# Patient Record
Sex: Female | Born: 1935 | Race: White | Hispanic: No | Marital: Married | Smoking: Never smoker
Health system: Southern US, Community
[De-identification: ages and names within clinical notes are randomized; demographics above are authoritative.]

## PROBLEM LIST (undated history)

## (undated) DIAGNOSIS — E785 Hyperlipidemia, unspecified: Secondary | ICD-10-CM

---

## 2012-07-14 ENCOUNTER — Encounter: Payer: Self-pay | Admitting: Emergency Medicine

## 2012-07-14 ENCOUNTER — Emergency Department (INDEPENDENT_AMBULATORY_CARE_PROVIDER_SITE_OTHER)
Admission: EM | Admit: 2012-07-14 | Discharge: 2012-07-14 | Disposition: A | Discharge status: Home / Self Care | Payer: Medicare Other | Source: Home / Self Care | Attending: Family Medicine | Admitting: Family Medicine

## 2012-07-14 ENCOUNTER — Emergency Department (INDEPENDENT_AMBULATORY_CARE_PROVIDER_SITE_OTHER): Payer: Medicare Other

## 2012-07-14 DIAGNOSIS — W19XXXA Unspecified fall, initial encounter: Secondary | ICD-10-CM

## 2012-07-14 DIAGNOSIS — J069 Acute upper respiratory infection, unspecified: Secondary | ICD-10-CM

## 2012-07-14 DIAGNOSIS — S298XXA Other specified injuries of thorax, initial encounter: Secondary | ICD-10-CM

## 2012-07-14 DIAGNOSIS — S20219A Contusion of unspecified front wall of thorax, initial encounter: Secondary | ICD-10-CM

## 2012-07-14 DIAGNOSIS — R05 Cough: Secondary | ICD-10-CM

## 2012-07-14 DIAGNOSIS — R059 Cough, unspecified: Secondary | ICD-10-CM

## 2012-07-14 HISTORY — DX: Hyperlipidemia, unspecified: E78.5

## 2012-07-14 MED ORDER — AMOXICILLIN 875 MG PO TABS: 875.0000 mg | ORAL_TABLET | Freq: Two times a day (BID) | ORAL | Status: AC

## 2012-07-14 MED ORDER — GUAIFENESIN-CODEINE 100-10 MG/5ML PO SYRP: 10.0000 mL | ORAL_SOLUTION | Freq: Every day | ORAL | Status: AC

## 2012-07-14 NOTE — ED Notes (Signed)
Patient hurt left posterior ribs 07-03-13. Now has been congested and coughing x 3-4 days. Did have Flu vaccination and is current on Pneumonia immunization.

## 2012-07-14 NOTE — Discharge Instructions (Signed)
Take plain Robitussin (guaifenesin) twice daily for cough and congestion.  Increase fluid intake, rest. Stop all antihistamines for now, and other non-prescription cough/cold preparations. May take Ibuprofen for chest soreness.  Apply heating pad to right chest several times daily. Follow-up with family doctor if not improving 7 to 10 days.

## 2012-07-14 NOTE — ED Provider Notes (Signed)
History     CSN: [id number]  Arrival date & time 07/14/12  1504   First MD Initiated Contact with Patient 07/14/12 1622      Chief Complaint  Patient presents with  . Cough  . Nasal Congestion      HPI Comments: Patient states that she fell about 1 week ago striking her right posterior/lateral chest and she has had persistent discomfort there.  She has now developed URI symptoms with sore throat, sinus congestion, and cough resulting in increased right rib pain.  She complains of occasion shortness of breath and wheezing.  No fevers, chills, and sweats. She has had two episodes of pneumonia in the past.   The history is provided by the patient and a relative.    Past Medical History  Diagnosis Date  . Hyperlipidemia     History reviewed. No pertinent past surgical history.  History reviewed. No pertinent family history.  History  Substance Use Topics  . Smoking status: Never Smoker   . Smokeless tobacco: Not on file  . Alcohol Use: No    OB History    Grav Para Term Preterm Abortions TAB SAB Ect Mult Living                  Review of Systems + sore throat + cough + pleuritic pain right ribs + wheezing + nasal congestion ? post-nasal drainage No sinus pain/pressure No itchy/red eyes No earache No hemoptysis + SOB No fever/chills No nausea No vomiting No abdominal pain No diarrhea No urinary symptoms No skin rashes + fatigue No myalgias No headache Used OTC meds without relief  Allergies  Review of patient's allergies indicates no known allergies.  Home Medications   Current Outpatient Rx  Name  Route  Sig  Dispense  Refill  . AMOXICILLIN 875 MG PO TABS   Oral   Take 1 tablet (875 mg total) by mouth 2 (two) times daily.   20 tablet   0   . GUAIFENESIN-CODEINE 100-10 MG/5ML PO SYRP   Oral   Take 10 mLs by mouth at bedtime. for cough   120 mL   0     BP 194/78  Pulse 78  Temp 99 F (37.2 C) (Oral)  Resp 18  Ht 5\' 5"  (1.651 m)  Wt  119 lb (53.978 kg)  BMI 19.80 kg/m2  SpO2 91%  Physical Exam Nursing notes and Vital Signs reviewed. Appearance:  Patient appears in no acute distress Eyes:  Pupils are equal, round, and reactive to light and accomodation.  Extraocular movement is intact.  Conjunctivae are not inflamed  Ears:  Canals normal.  Tympanic membranes normal.  Nose:  Mildly congested turbinates.  No sinus tenderness.   Pharynx:  Normal Neck:  Supple.  No adenopathy Lungs:   Scattered rhonchi; no rales or wheezes.  Breath sounds are equal.  Chest:  Tenderness over right lateral/inferior ribs Heart:  Regular rate and rhythm without murmurs, rubs, or gallops.  Abdomen:  Nontender without masses or hepatosplenomegaly.  Bowel sounds are present.  No CVA or flank tenderness.  Extremities:  No edema.  No calf tenderness Skin:  No rash present.   ED Course  Procedures  none   Dg Ribs Unilateral W/chest Right  07/14/2012  *RADIOLOGY REPORT*  Clinical Data: Larey Seat and injured right lateral chest 1 week ago. Cough.  RIGHT RIBS AND CHEST - 3+ VIEW  Comparison: None.  Findings: Site of maximal pain and tenderness was marked with a metallic  BB.  No right rib fractures identified.  Generalized osseous demineralization.  Prominent costal cartilage calcification.  Cardiac silhouette normal in size.  Thoracic aorta mildly atherosclerotic.  Hilar and mediastinal contours otherwise unremarkable.  Lungs clear.  Bronchovascular markings normal. Pulmonary vascularity normal.  No pneumothorax.  No pleural effusions.  IMPRESSION:  1.  No right rib fractures identified.  Generalized osseous demineralization. 2.  No acute cardiopulmonary disease.   Original Report Authenticated By: Hulan Saas, M.D.      1. Contusion of ribs   2. Acute upper respiratory infections of unspecified site       MDM  With a history of pneumonia and right rib pain, will begin empiric amoxicillin.  Rx written for guaifenesin/codeine at bedtime. Take plain  Robitussin (guaifenesin) twice daily for cough and congestion.  Increase fluid intake, rest. Stop all antihistamines for now, and other non-prescription cough/cold preparations. May take Ibuprofen for chest soreness.  Apply heating pad to right chest several times daily. Follow-up with family doctor if not improving 7 to 10 days.         Lattie Haw, MD 07/18/12 [phone]

## 2012-08-24 ENCOUNTER — Emergency Department (INDEPENDENT_AMBULATORY_CARE_PROVIDER_SITE_OTHER)
Admission: EM | Admit: 2012-08-24 | Discharge: 2012-08-24 | Disposition: A | Discharge status: Home / Self Care | Payer: Medicare Other | Source: Home / Self Care | Attending: Family Medicine | Admitting: Family Medicine

## 2012-08-24 DIAGNOSIS — R309 Painful micturition, unspecified: Secondary | ICD-10-CM

## 2012-08-24 DIAGNOSIS — R300 Dysuria: Secondary | ICD-10-CM

## 2012-08-24 DIAGNOSIS — N3000 Acute cystitis without hematuria: Secondary | ICD-10-CM

## 2012-08-24 LAB — POCT URINALYSIS DIP (MANUAL ENTRY)
Protein Ur, POC: >=300
Spec Grav, UA: 1.025 (ref 1.005–1.03)
pH, UA: 6 (ref 5–8)

## 2012-08-24 MED ORDER — CEPHALEXIN 500 MG PO CAPS: 500.0000 mg | ORAL_CAPSULE | Freq: Two times a day (BID) | ORAL | Status: AC

## 2012-08-24 NOTE — ED Provider Notes (Signed)
History     CSN: [id number]  Arrival date & time 08/24/12  4098   First MD Initiated Contact with Patient 08/24/12 1049      Chief Complaint  Patient presents with  . Dysuria    x 1 day       Patient is a 79 y.o. female presenting with dysuria. The history is provided by the patient and a relative.  Dysuria  This is a new problem. The current episode started 2 days ago. The problem occurs every urination. The problem has been gradually worsening. The quality of the pain is described as burning. The pain is mild. There has been no fever. Associated symptoms include frequency, hematuria, hesitancy and urgency. Pertinent negatives include no chills, no sweats, no nausea, no vomiting, no discharge and no flank pain. She has tried nothing for the symptoms.    Past Medical History  Diagnosis Date  . Hyperlipidemia     History reviewed. No pertinent past surgical history.  Family History  Problem Relation Age of Onset  . Cancer Mother     breast  . Cancer Father     lung    History  Substance Use Topics  . Smoking status: Never Smoker   . Smokeless tobacco: Not on file  . Alcohol Use: No    OB History   Grav Para Term Preterm Abortions TAB SAB Ect Mult Living                  Review of Systems  Constitutional: Negative for chills.  Gastrointestinal: Negative for nausea and vomiting.  Genitourinary: Positive for dysuria, hesitancy, urgency, frequency and hematuria. Negative for flank pain.  All other systems reviewed and are negative.    Allergies  Review of patient's allergies indicates no known allergies.  Home Medications   Current Outpatient Rx  Name  Route  Sig  Dispense  Refill  . fish oil-omega-3 fatty acids 1000 MG capsule   Oral   Take 2 g by mouth daily.         . Red Yeast Rice 600 MG CAPS   Oral   Take by mouth.         Marland Kitchen amoxicillin (AMOXIL) 875 MG tablet   Oral   Take 1 tablet (875 mg total) by mouth 2 (two) times daily.   20  tablet   0   . cephALEXin (KEFLEX) 500 MG capsule   Oral   Take 1 capsule (500 mg total) by mouth 2 (two) times daily.   14 capsule   0   . guaiFENesin-codeine (ROBITUSSIN AC) 100-10 MG/5ML syrup   Oral   Take 10 mLs by mouth at bedtime. for cough   120 mL   0     BP 163/98  Pulse 80  Temp(Src) 97.8 F (36.6 C) (Oral)  Wt 91 lb (41.277 kg)  BMI 15.14 kg/m2  SpO2 97%  Physical Exam Nursing notes and Vital Signs reviewed. Appearance:  Patient appears healthy, stated age, and in no acute distress Eyes:  Pupils are equal, round, and reactive to light and accomodation.  Extraocular movement is intact.  Conjunctivae are not inflamed  Pharynx:  Normal; moist mucous membranes  Neck:  Supple.  No adenopathy Lungs:  Clear to auscultation.  Breath sounds are equal.  Heart:  Regular rate and rhythm without murmurs, rubs, or gallops.  Abdomen:  Nontender without masses or hepatosplenomegaly.  Bowel sounds are present.  No CVA or flank tenderness.  Extremities:  No  edema.  No calf tenderness Skin:  No rash present.   ED Course  Procedures  none  Labs Reviewed  POCT URINALYSIS DIP (MANUAL ENTRY) - Abnormal; Notable for the following: BIL small; KET trace; BLO large; PRO >= 300mg /dL; LEU large  URINE CULTURE pending      1. Painful urination   2. Acute cystitis       MDM  Urine culture pending. Begin Keflex. Increase fluid intake.  May take AZO, one tab three times daily for two days. If symptoms become significantly worse during the night or over the weekend, proceed to the local emergency room Followup with Family Doctor if not improved in one week.         Lattie Haw, MD 08/25/12 [phone]

## 2012-08-26 LAB — URINE CULTURE

## 2012-08-27 ENCOUNTER — Telehealth: Payer: Self-pay | Admitting: *Deleted

## 2012-08-27 NOTE — ED Notes (Signed)
spoke to pt she reports that her UTI s/s are better. she reports having some diarrhea yesterday which is better today. advised her of her urine culture results and to call back if she fails to improve or worsens and we can change ABT to Cipro 250mg  BID #10/0rf per dr Cathren Harsh. Pt agrees.

## 2013-07-19 ENCOUNTER — Encounter: Payer: Self-pay | Admitting: Emergency Medicine

## 2013-07-19 ENCOUNTER — Emergency Department
Admission: EM | Admit: 2013-07-19 | Discharge: 2013-07-19 | Disposition: A | Discharge status: Home / Self Care | Payer: Medicare Other | Source: Home / Self Care | Attending: Emergency Medicine | Admitting: Emergency Medicine

## 2013-07-19 DIAGNOSIS — H6690 Otitis media, unspecified, unspecified ear: Secondary | ICD-10-CM

## 2013-07-19 DIAGNOSIS — H6692 Otitis media, unspecified, left ear: Secondary | ICD-10-CM

## 2013-07-19 NOTE — ED Provider Notes (Signed)
CSN: [id number]     Arrival date & time 07/19/13  1758 History   First MD Initiated Contact with Patient 07/19/13 1833     Chief Complaint  Patient presents with  . Otalgia   (Consider location/radiation/quality/duration/timing/severity/associated sxs/prior Treatment) Patient is a 80 y.o. female presenting with ear pain. The history is provided by the patient (and daughter).  Otalgia Location:  Left Quality:  Unable to specify Severity:  Moderate Onset quality:  Unable to specify Duration:  3 days Timing:  Constant Progression:  Unchanged Chronicity:  New Context: not direct blow, not elevation change, not foreign body in ear, not loud noise and no water in ear   Ineffective treatments: Tylenol. Associated symptoms: no abdominal pain, no congestion, no cough, no diarrhea, no ear discharge, no fever, no headaches, no hearing loss, no neck pain, no rash, no rhinorrhea, no sore throat, no tinnitus and no vomiting   Risk factors: no prior ear surgery     Past Medical History  Diagnosis Date  . Hyperlipidemia    History reviewed. No pertinent past surgical history. Family History  Problem Relation Age of Onset  . Cancer Mother     breast  . Cancer Father     lung   History  Substance Use Topics  . Smoking status: Never Smoker   . Smokeless tobacco: Not on file  . Alcohol Use: No   OB History   Grav Para Term Preterm Abortions TAB SAB Ect Mult Living                 Review of Systems  Constitutional: Negative for fever.  HENT: Positive for ear pain. Negative for congestion, ear discharge, hearing loss, rhinorrhea, sore throat and tinnitus.   Respiratory: Negative for cough.   Gastrointestinal: Negative for vomiting, abdominal pain and diarrhea.  Musculoskeletal: Negative for neck pain.  Skin: Negative for rash.  Neurological: Negative for headaches.  All other systems reviewed and are negative.    Allergies  Review of patient's allergies indicates no known  allergies.  Home Medications   Current Outpatient Rx  Name  Route  Sig  Dispense  Refill  . amoxicillin (AMOXIL) 875 MG tablet   Oral   Take 1 tablet (875 mg total) by mouth 2 (two) times daily.   20 tablet   0   . cephALEXin (KEFLEX) 500 MG capsule   Oral   Take 1 capsule (500 mg total) by mouth 2 (two) times daily.   14 capsule   0   . fish oil-omega-3 fatty acids 1000 MG capsule   Oral   Take 2 g by mouth daily.         Marland Kitchen guaiFENesin-codeine (ROBITUSSIN AC) 100-10 MG/5ML syrup   Oral   Take 10 mLs by mouth at bedtime. for cough   120 mL   0   . Red Yeast Rice 600 MG CAPS   Oral   Take by mouth.          BP 182/82  Pulse 83  Temp(Src) 97.7 F (36.5 C) (Oral)  Resp 16  Ht 5\' 5"  (1.651 m)  Wt 116 lb 4 oz (52.731 kg)  BMI 19.35 kg/m2  SpO2 96% Physical Exam  Nursing note and vitals reviewed. Constitutional: She is oriented to person, place, and time. She appears well-developed and well-nourished. No distress.  HENT:  Head: Normocephalic and atraumatic.  Right Ear: Tympanic membrane, external ear and ear canal normal. No tenderness.  Left Ear: External ear and  ear canal normal. No drainage or tenderness. Tympanic membrane is injected, perforated (Tiny) and erythematous.  Nose: Mucosal edema (Minimal) present. No rhinorrhea.  Mouth/Throat: Oropharynx is clear and moist and mucous membranes are normal. No oral lesions.  Eyes: Conjunctivae are normal. No scleral icterus.  Neck: Neck supple.  Cardiovascular: Normal rate, regular rhythm and normal heart sounds.   Pulmonary/Chest: Effort normal and breath sounds normal.  Lymphadenopathy:    She has no cervical adenopathy.  Neurological: She is alert and oriented to person, place, and time.  Skin: Skin is warm and dry.    ED Course  Procedures (including critical care time) Labs Review Labs Reviewed - No data to display Imaging Review No results found.  EKG Interpretation    Date/Time:    Ventricular  Rate:    PR Interval:    QRS Duration:   QT Interval:    QTC Calculation:   R Axis:     Text Interpretation:              MDM   1. Left otitis media    With tiny perforation .  Treatment options discussed, as well as risks, benefits, alternatives. Patient and daughter voiced understanding and agreement with the following plans: Amoxicillin 875 mg twice a day x10 days  Ibuprofen 600 mg every 6 hours with food when necessary pain Followup with ENT within 10 days. Precautions discussed. Red flags discussed. Questions invited and answered. Patient and daughter voiced understanding and agreement.      Lajean Manesavid Massey, MD 07/19/13 2009

## 2013-07-19 NOTE — ED Notes (Addendum)
Patient c/o left ear pain states she was diagnosed on Weds with a rupture eardrum. She was not given anything at that time. She has tried OTC Tylenol without relief.

## 2013-11-11 IMAGING — CR DG RIBS W/ CHEST 3+V*R*
3 series · 3 of 3 positions shown · non-contrast
Comparison: None.

CLINICAL DATA: Fell and injured right lateral chest 1 week ago.
Cough.

RIGHT RIBS AND CHEST - 3+ VIEW

[view not recorded (1 of 3)]
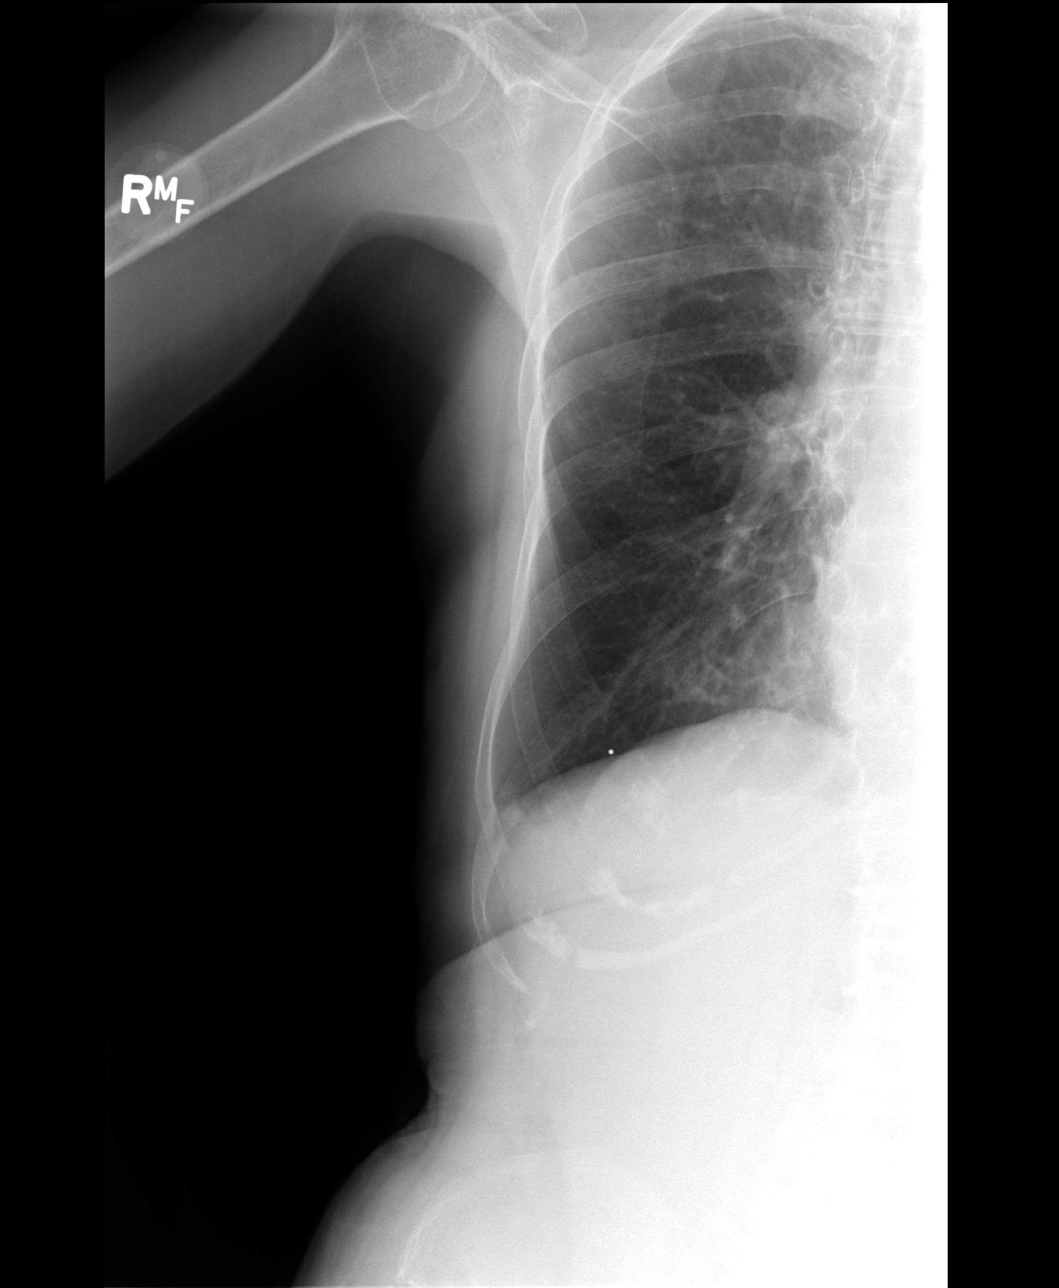

[view not recorded (2 of 3)]
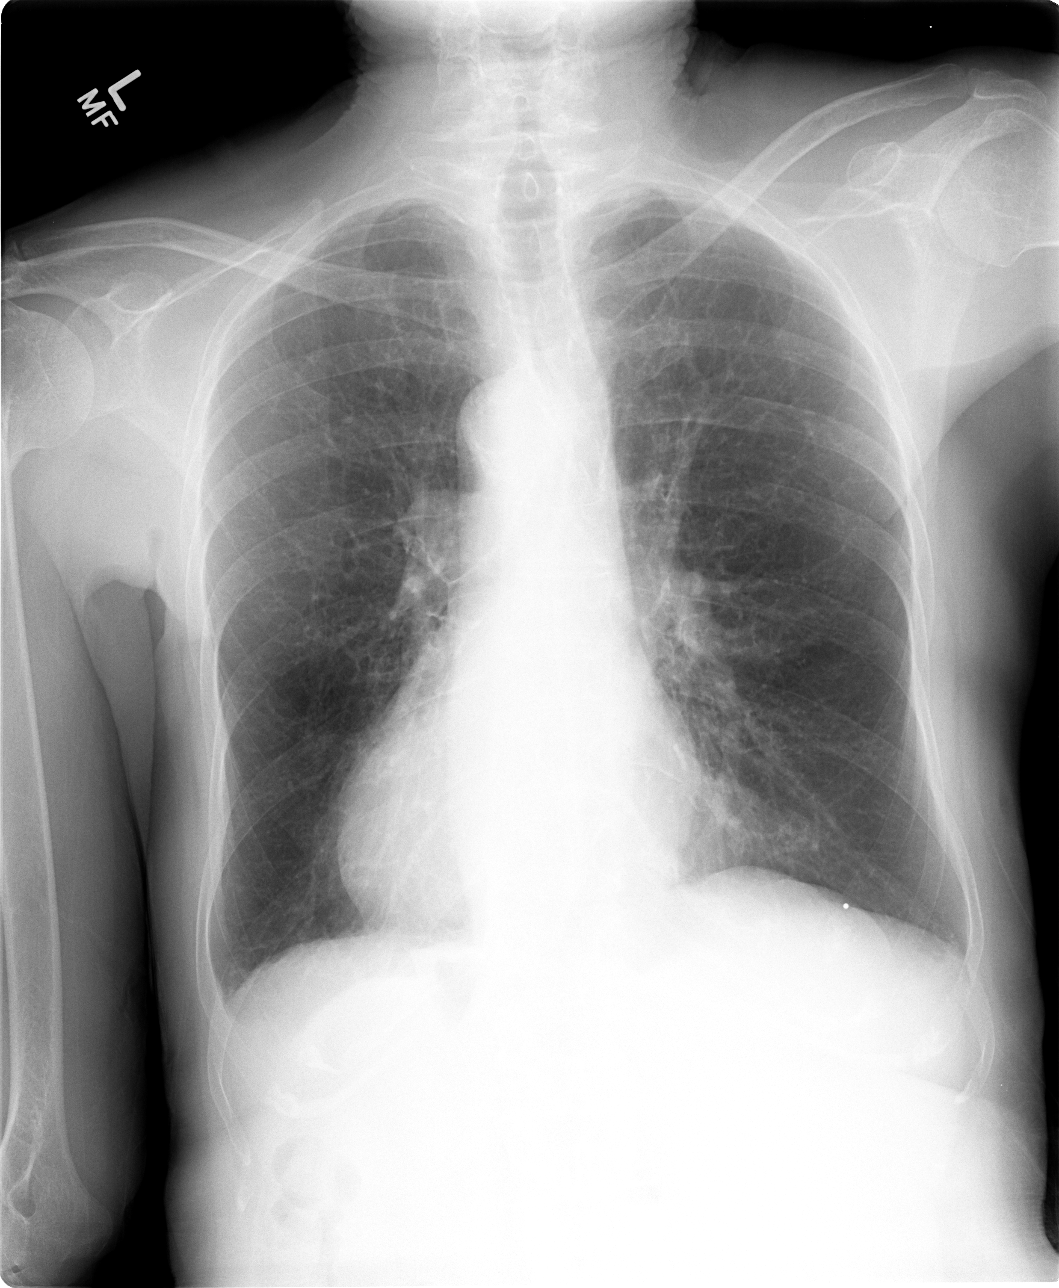

[view not recorded (3 of 3)]
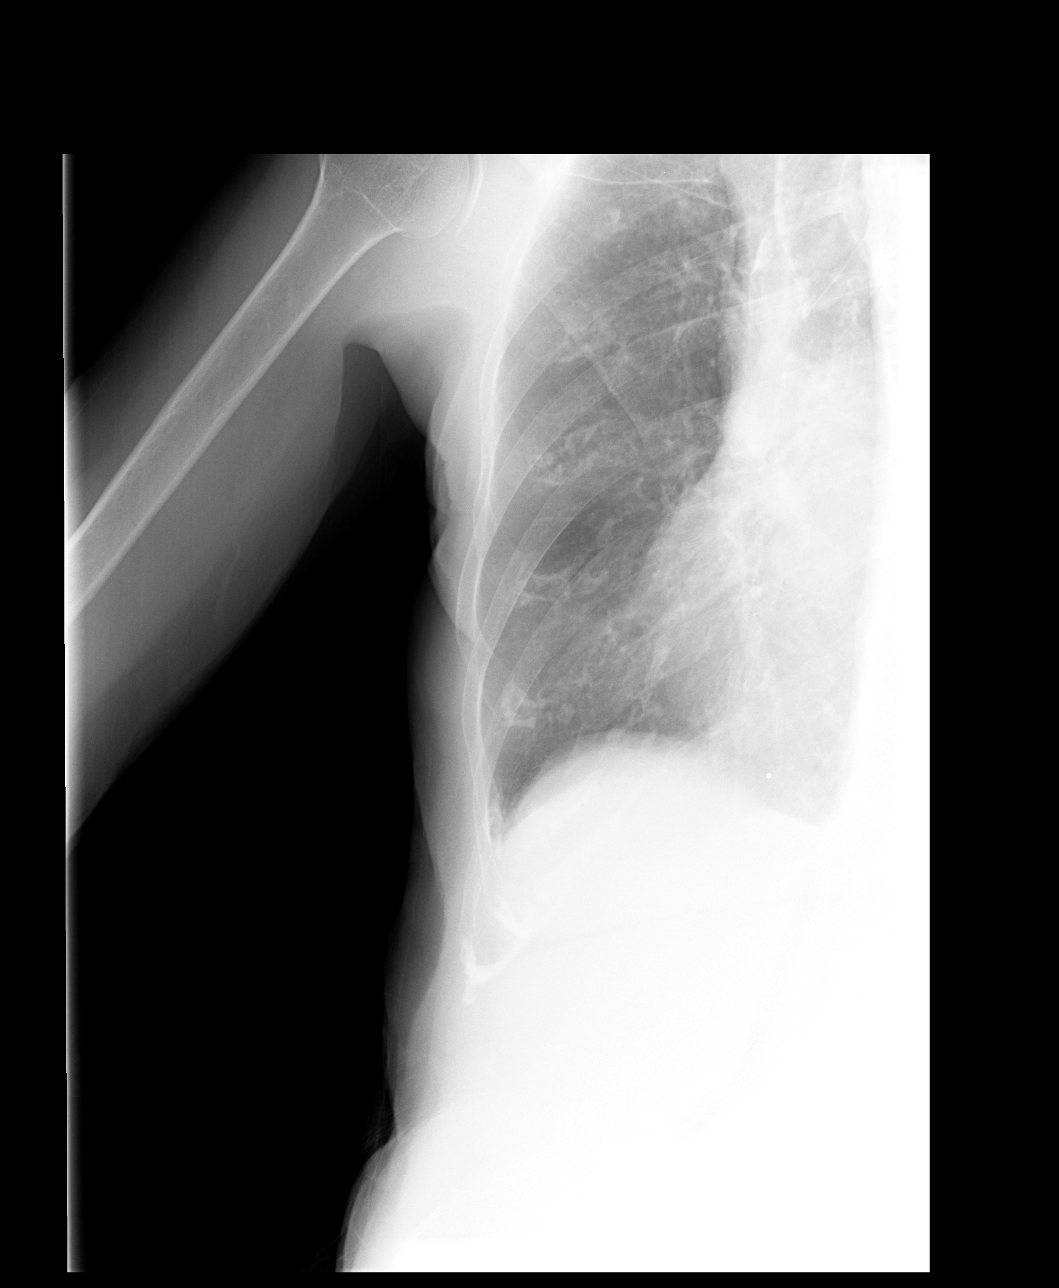

[3 of 3 positions shown; findings below may reference images not displayed]

FINDINGS: Site of maximal pain and tenderness was marked with a
metallic BB.  No right rib fractures identified.  Generalized
osseous demineralization.  Prominent costal cartilage
calcification.

Cardiac silhouette normal in size.  Thoracic aorta mildly
atherosclerotic.  Hilar and mediastinal contours otherwise
unremarkable.  Lungs clear.  Bronchovascular markings normal.
Pulmonary vascularity normal.  No pneumothorax.  No pleural
effusions.
IMPRESSION: 1.  No right rib fractures identified.  Generalized osseous
demineralization.
2.  No acute cardiopulmonary disease.
